# Patient Record
Sex: Female | Born: 1980 | Race: White | Hispanic: No | Marital: Married | Smoking: Never smoker
Health system: Southern US, Community
[De-identification: ages and names within clinical notes are randomized; demographics above are authoritative.]

## PROBLEM LIST (undated history)

## (undated) HISTORY — PX: EYE MUSCLE SURGERY: SHX370

---

## 2004-10-31 ENCOUNTER — Observation Stay: Payer: Self-pay | Admitting: Obstetrics and Gynecology

## 2004-11-07 ENCOUNTER — Observation Stay: Payer: Self-pay | Admitting: Obstetrics and Gynecology

## 2004-11-12 ENCOUNTER — Inpatient Hospital Stay: Payer: Self-pay | Admitting: Obstetrics and Gynecology

## 2014-06-25 ENCOUNTER — Ambulatory Visit: Payer: Self-pay | Admitting: Family Medicine

## 2014-10-03 IMAGING — US ABDOMEN ULTRASOUND
1 series · 14 of 25 positions shown · non-contrast
Comparison: None.

CLINICAL DATA: Right upper quadrant pain .

EXAM:
ULTRASOUND ABDOMEN COMPLETE

[Series 1: abdomen ultrasound · 0.26mm/px · 14 of 77 slices shown]
[im 1/77]
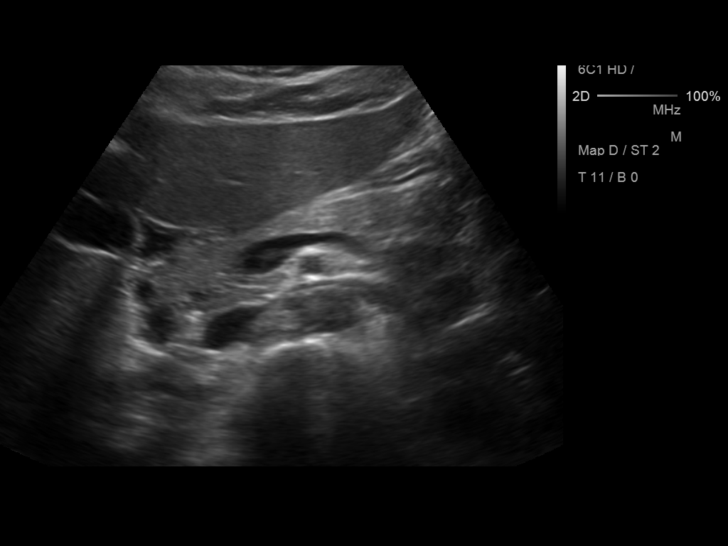
[im 7/77]
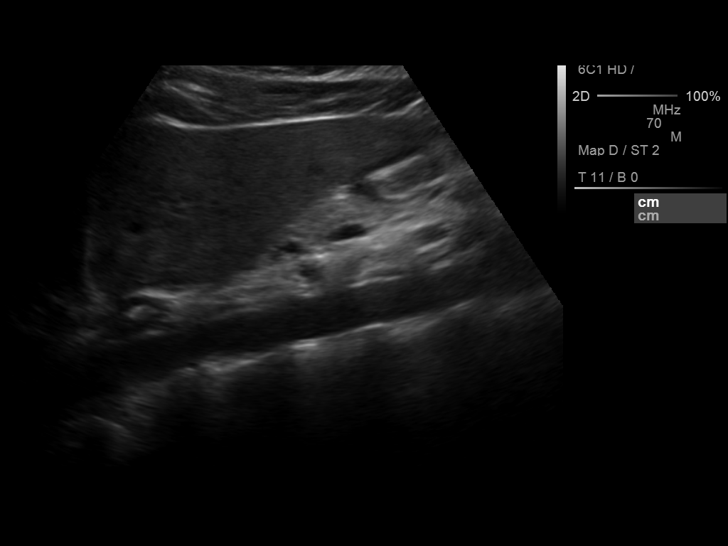
[im 13/77]
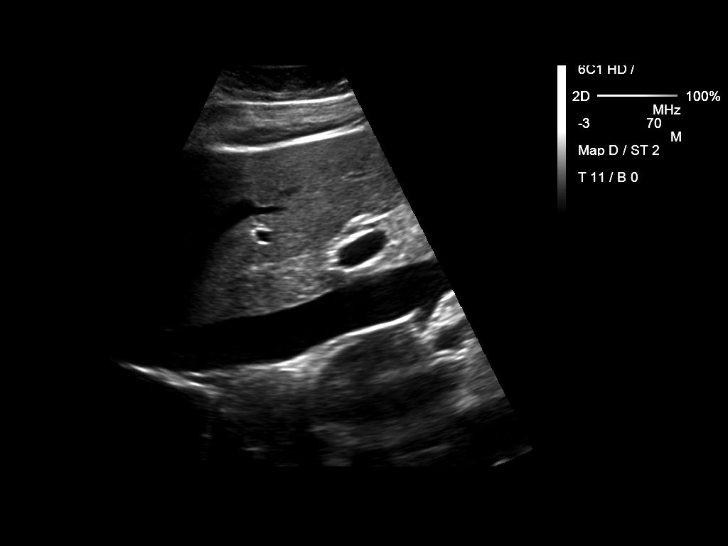
[im 20/77]
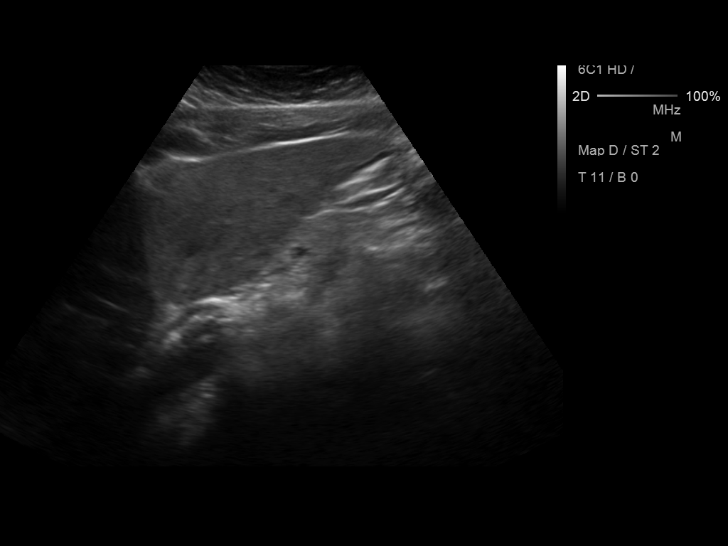
[im 26/77]
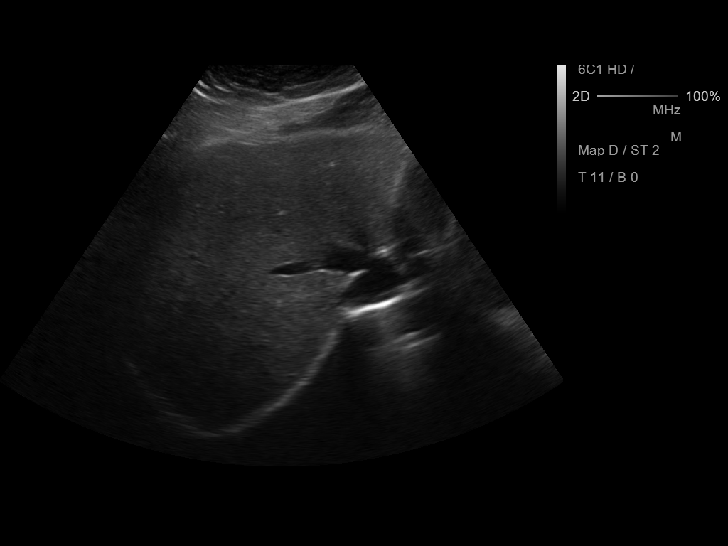
[im 29/77]
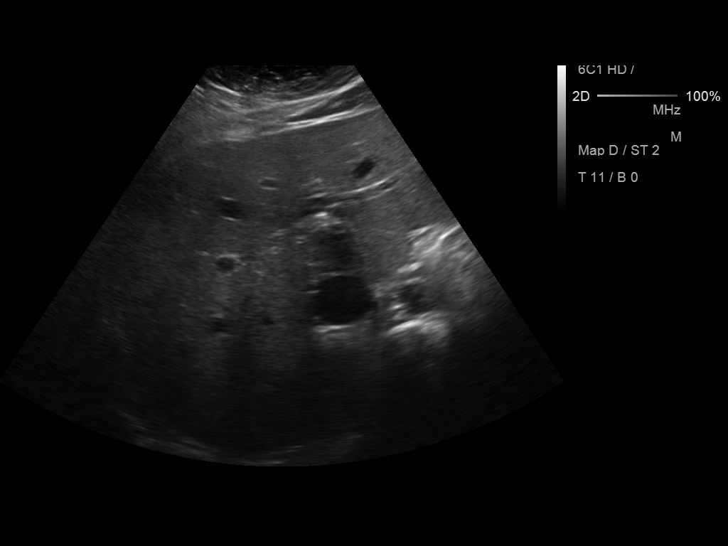
[im 35/77]
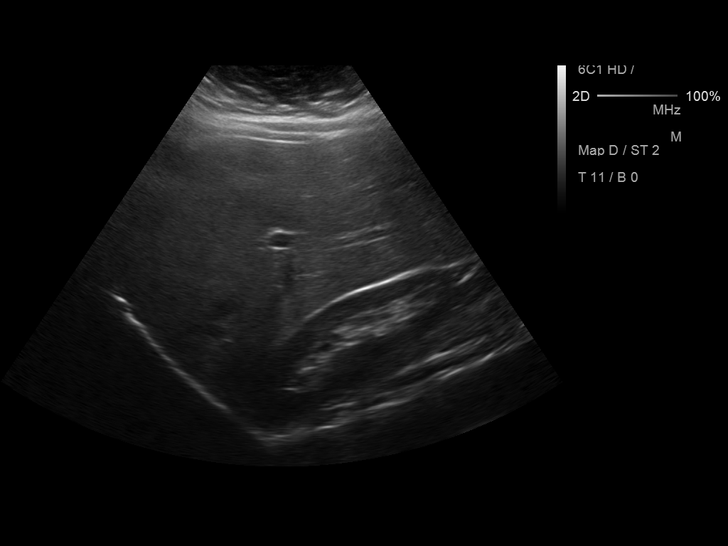
[im 42/77]
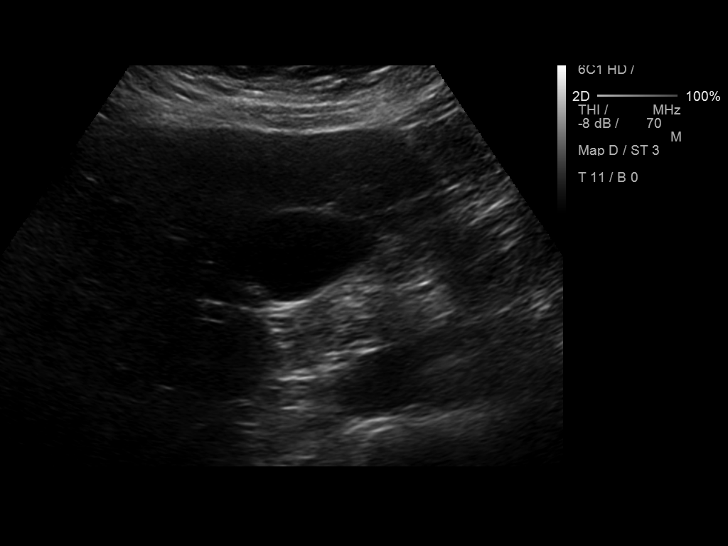
[im 48/77]
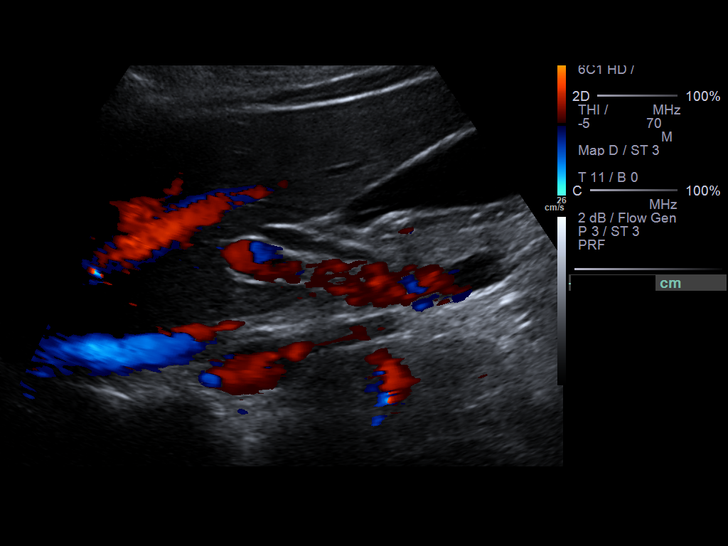
[im 51/77]
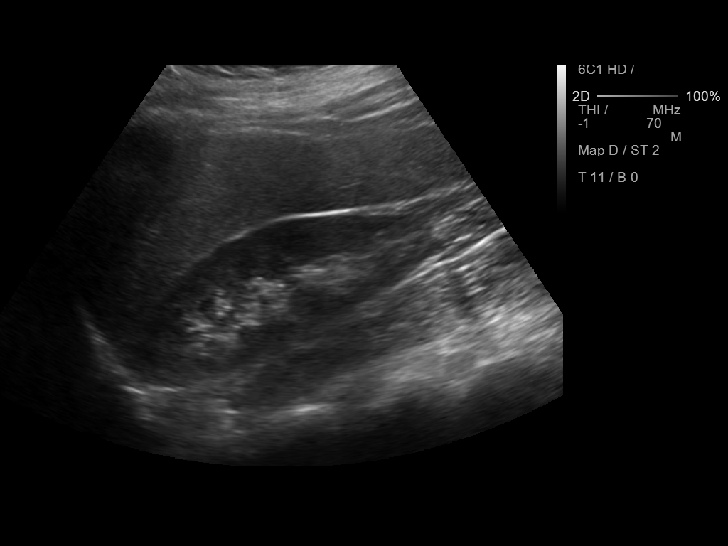
[im 58/77]
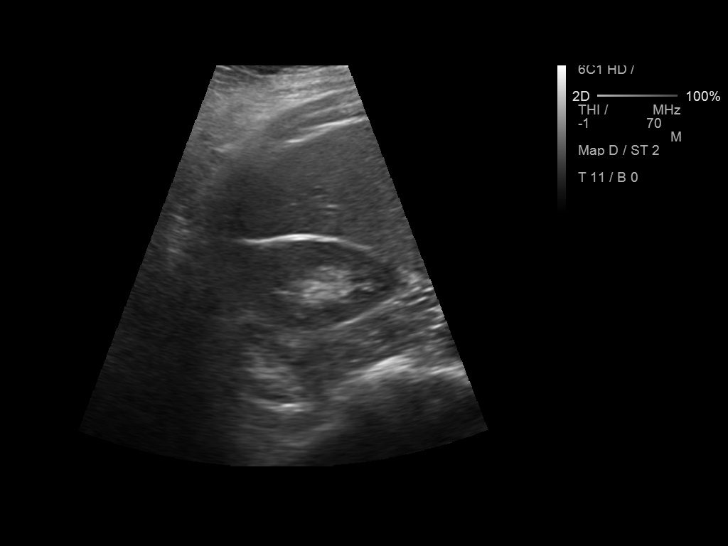
[im 64/77]
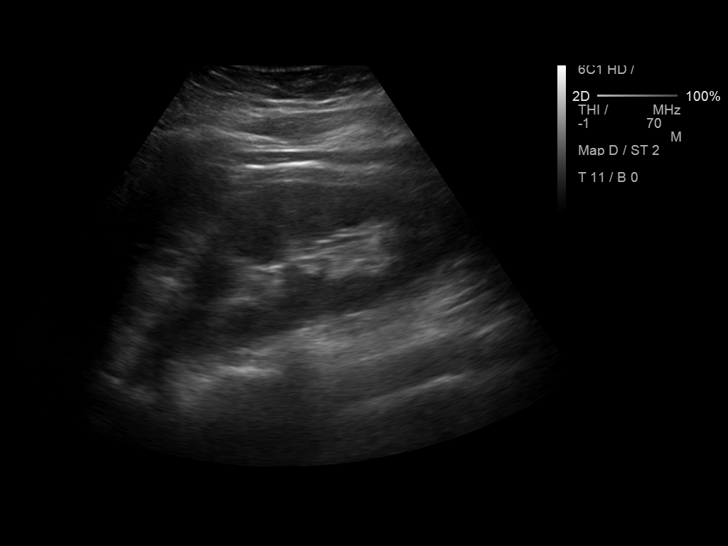
[im 70/77]
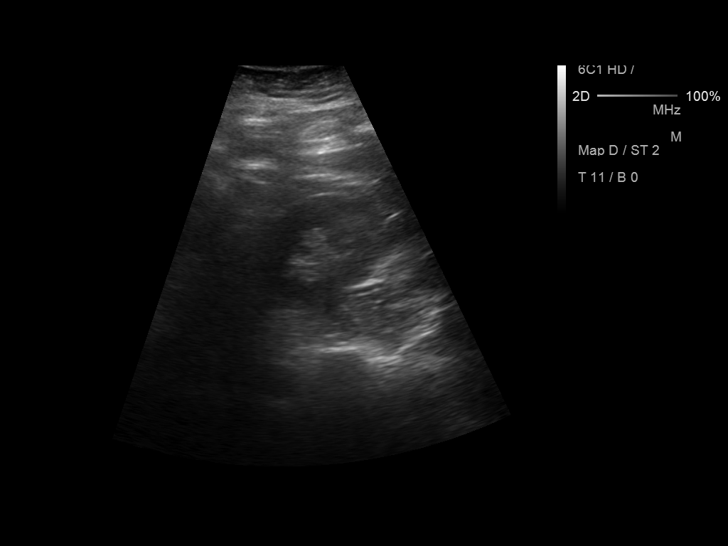
[im 77/77]
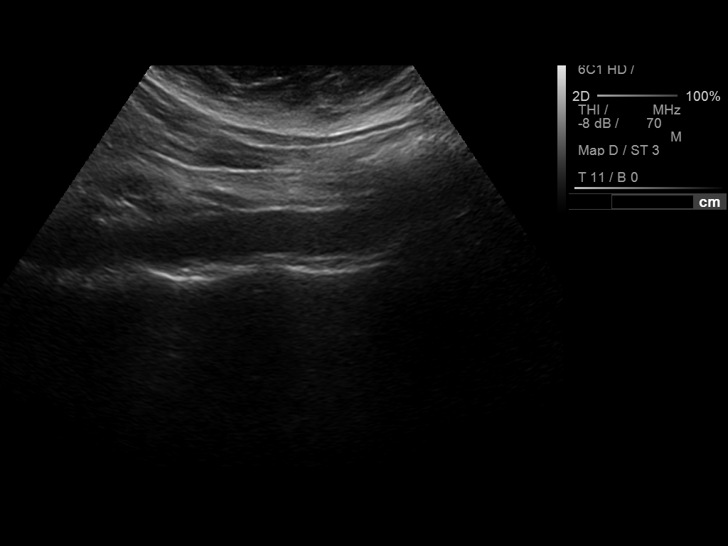

[14 of 25 positions shown; findings below may reference images not displayed]

FINDINGS: Gallbladder:

No gallstones or wall thickening visualized. No sonographic Murphy
sign noted.

Common bile duct:

Diameter: 6.2 mm

Liver:

Mild increased echogenicity liver noted suggesting fatty
infiltration. Mild hepatomegaly cannot be excluded.

IVC:

No abnormality visualized.

Pancreas:

Visualized portion unremarkable.

Spleen:

Size and appearance within normal limits.

Right Kidney:

Length: 11.6 cm. Echogenicity within normal limits. No mass or
hydronephrosis visualized.

Left Kidney:

Length: 12.3 cm. Echogenicity within normal limits. No mass or
hydronephrosis visualized.

Abdominal aorta:

No aneurysm visualized.

Other findings:

None.
IMPRESSION: Fatty infiltration of the liver. Mild hepatomegaly cannot be
excluded. No focal abnormality otherwise noted.

## 2015-05-30 ENCOUNTER — Emergency Department (HOSPITAL_COMMUNITY)
Admission: EM | Admit: 2015-05-30 | Discharge: 2015-05-30 | Disposition: A | Discharge status: Home / Self Care | Payer: Worker's Compensation | Attending: Emergency Medicine | Admitting: Emergency Medicine

## 2015-05-30 ENCOUNTER — Encounter (HOSPITAL_COMMUNITY): Payer: Self-pay | Admitting: *Deleted

## 2015-05-30 ENCOUNTER — Emergency Department (HOSPITAL_COMMUNITY): Payer: Worker's Compensation

## 2015-05-30 DIAGNOSIS — X58XXXA Exposure to other specified factors, initial encounter: Secondary | ICD-10-CM | POA: Diagnosis not present

## 2015-05-30 DIAGNOSIS — Y9289 Other specified places as the place of occurrence of the external cause: Secondary | ICD-10-CM | POA: Insufficient documentation

## 2015-05-30 DIAGNOSIS — S60221A Contusion of right hand, initial encounter: Secondary | ICD-10-CM | POA: Insufficient documentation

## 2015-05-30 DIAGNOSIS — S6991XA Unspecified injury of right wrist, hand and finger(s), initial encounter: Secondary | ICD-10-CM | POA: Diagnosis present

## 2015-05-30 DIAGNOSIS — Y9389 Activity, other specified: Secondary | ICD-10-CM | POA: Insufficient documentation

## 2015-05-30 DIAGNOSIS — Y990 Civilian activity done for income or pay: Secondary | ICD-10-CM | POA: Diagnosis not present

## 2015-05-30 MED ORDER — IBUPROFEN 800 MG PO TABS
800.0000 mg | ORAL_TABLET | Freq: Once | ORAL | Status: AC
  Administered 2015-05-30: 800 mg via ORAL
  Filled 2015-05-30: qty 1

## 2015-05-30 MED ORDER — IBUPROFEN 800 MG PO TABS: 800.0000 mg | ORAL_TABLET | Freq: Three times a day (TID) | ORAL | Status: AC

## 2015-05-30 MED ORDER — ACETAMINOPHEN 325 MG PO TABS
650.0000 mg | ORAL_TABLET | Freq: Once | ORAL | Status: AC
  Administered 2015-05-30: 650 mg via ORAL
  Filled 2015-05-30: qty 2

## 2015-05-30 NOTE — ED Provider Notes (Signed)
CSN: [id number]     Arrival date & time 05/30/15  1556 History   First MD Initiated Contact with Patient 05/30/15 1622     Chief Complaint  Patient presents with  . Hand Pain     (Consider location/radiation/quality/duration/timing/severity/associated sxs/prior Treatment) Patient is a 34 y.o. female presenting with hand injury. The history is provided by the patient.  Hand Injury Location:  Hand Time since incident: earlier today. 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 Hand location:  R hand Pain details:    Quality:  Aching and throbbing   Radiates to:  R forearm   Severity:  Moderate   Onset quality:  Gradual   Timing:  Constant   Progression:  Worsening Chronicity:  New Handedness:  Right-handed Dislocation: no   Foreign body present:  No foreign bodies Relieved by:  Nothing Worsened by:  Movement (palpation) Ineffective treatments:  None tried Associated symptoms: decreased range of motion and tingling   Associated symptoms: no back pain, no neck pain, no numbness and no stiffness   Risk factors: no known bone disorder and no frequent fractures     History reviewed. No pertinent past medical history. Past Surgical History  Procedure Laterality Date  . Eye muscle surgery     History reviewed. No pertinent family history. History  Substance Use Topics  . Smoking status: Never Smoker   . Smokeless tobacco: Not on file  . Alcohol Use: Yes     Comment: once a week   OB History    No data available     Review of Systems  Constitutional: Negative for activity change.       All ROS Neg except as noted in HPI  HENT: Negative for nosebleeds.   Eyes: Negative for photophobia and discharge.  Respiratory: Negative for cough, shortness of breath and wheezing.   Cardiovascular: Negative for chest pain and palpitations.  Gastrointestinal: Negative for abdominal pain and blood in stool.    Genitourinary: Negative for dysuria, frequency and hematuria.  Musculoskeletal: Negative for back pain, arthralgias, stiffness and neck pain.  Skin: Negative.   Neurological: Negative for dizziness, seizures and speech difficulty.  Psychiatric/Behavioral: Negative for hallucinations and confusion.      Allergies  Review of patient's allergies indicates no known allergies.  Home Medications   Prior to Admission medications   Medication Sig Start Date End Date Taking? Authorizing Provider  ibuprofen (ADVIL,MOTRIN) 800 MG tablet Take 1 tablet (800 mg total) by mouth 3 (three) times daily. 05/30/15   Ivery Quale, PA-C   BP 142/63 mmHg  Pulse 94  Temp(Src) 98.7 F (37.1 C) (Oral)  Resp 16  Ht 5\' 10"  (1.778 m)  Wt 230 lb (104.327 kg)  BMI 33.00 kg/m2  SpO2 97% Physical Exam  Constitutional: She is oriented to person, place, and time. She appears well-developed and well-nourished.  Non-toxic appearance.  HENT:  Head: Normocephalic.  Right Ear: Tympanic membrane and external ear normal.  Left Ear: Tympanic membrane and external ear normal.  Eyes: EOM and lids are normal. Pupils are equal, round, and reactive to light.  Neck: Normal range of motion. Neck supple. Carotid bruit is not present.  Cardiovascular: Normal rate, regular rhythm, normal heart sounds, intact distal pulses and normal pulses.   Pulmonary/Chest: Breath sounds normal. No respiratory distress.  Abdominal: Soft. Bowel sounds are normal. There is no tenderness. There is no guarding.  Musculoskeletal: Normal range of motion.  Right hand: She exhibits tenderness and swelling. She exhibits normal capillary refill and no deformity. Normal sensation noted. Normal strength noted.       Hands: Lymphadenopathy:       Head (right side): No submandibular adenopathy present.       Head (left side): No submandibular adenopathy present.    She has no cervical adenopathy.  Neurological: She is alert and oriented to  person, place, and time. She has normal strength. No cranial nerve deficit or sensory deficit.  Skin: Skin is warm and dry.  Psychiatric: She has a normal mood and affect. Her speech is normal.  Nursing note and vitals reviewed.   ED Course  Procedures (including critical care time) Labs Review Labs Reviewed - No data to display  Imaging Review Dg Hand Complete Right  05/30/2015   CLINICAL DATA:  RIGHT hand pain.  Hand injury today.  EXAM: RIGHT HAND - COMPLETE 3+ VIEW  COMPARISON:  None.  FINDINGS: There is no evidence of fracture or dislocation. There is no evidence of arthropathy or other focal bone abnormality. Soft tissues are unremarkable.  IMPRESSION: Negative.   Electronically Signed   By: Andreas Newport M.D.   On: 05/30/2015 16:53     EKG Interpretation None      MDM  Xray of the right hand is negative for fx or dislocation. No neurovascular deficit noted. Watson-Jones dressing and ice pack provided. Pt to follow up with orthopedics if not improving.   Final diagnoses:  Contusion, hand, right, initial encounter    *I have reviewed nursing notes, vital signs, and all appropriate lab and imaging results for this patient.[address], PA-C 06/01/15 1315  Eber Hong, MD 06/02/15 [phone]

## 2015-05-30 NOTE — Discharge Instructions (Signed)
Your x-ray is negative for fracture or dislocation. Please apply ice to your hand, and keep your hand elevated above your heart. Use  of ibuprofen and  of tylenol three times daily. Please use the splint until July 28. Please see the orthopedic specialist listed above if not improving. Hand Contusion  A hand contusion is a deep bruise to the hand. Contusions happen when an injury causes bleeding under the skin. Signs of bruising include pain, puffiness (swelling), and discolored skin. The contusion may turn blue, purple, or yellow. HOME CARE  Put ice on the injured area.  Put ice in a plastic bag.  Place a towel between your skin and the bag.  Leave the ice on for 15-20 minutes, 03-04 times a day.  Only take medicines as told by your doctor.  Use an elastic wrap only as told. You may remove the wrap for sleeping, showering, and bathing. Take the wrap off if you lose feeling (have numbness) in your fingers, or they turn blue or cold. Put the wrap on more loosely.  Keep the hand raised (elevated) with pillows.  Avoid using your hand too much if it is painful. GET HELP RIGHT AWAY IF:   You have more redness, puffiness, or pain in your hand.  Your puffiness or pain does not get better with medicine.  You lose feeling in your hand, or you cannot move your fingers.  Your hand turns cold or blue.  You have pain when you move your fingers.  Your hand feels warm.  Your contusion does not get better in 2 days. MAKE SURE YOU:   Understand these instructions.  Will watch this condition.  Will get help right away if you are not doing well or you get worse. Document Released: 04/09/2008 Document Revised: 03/08/2014 Document Reviewed: 04/14/2012 [facility] Patient Information 2015 [place], [place]. This information is not intended to replace advice given to you by your health care provider. Make sure you discuss any questions you have with your health care provider.

## 2015-05-30 NOTE — ED Notes (Signed)
Pt given discharge instruction and work man comp paperwork- escorted to lab to provide urine specimen  For post injury drug screen

## 2015-05-30 NOTE — ED Notes (Signed)
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

## 2015-09-07 IMAGING — DX DG HAND COMPLETE 3+V*R*
3 series · 3 of 3 positions shown · non-contrast
Comparison: None.

CLINICAL DATA: RIGHT hand pain.  Hand injury today.

EXAM:
RIGHT HAND - COMPLETE 3+ VIEW

[hand pa]
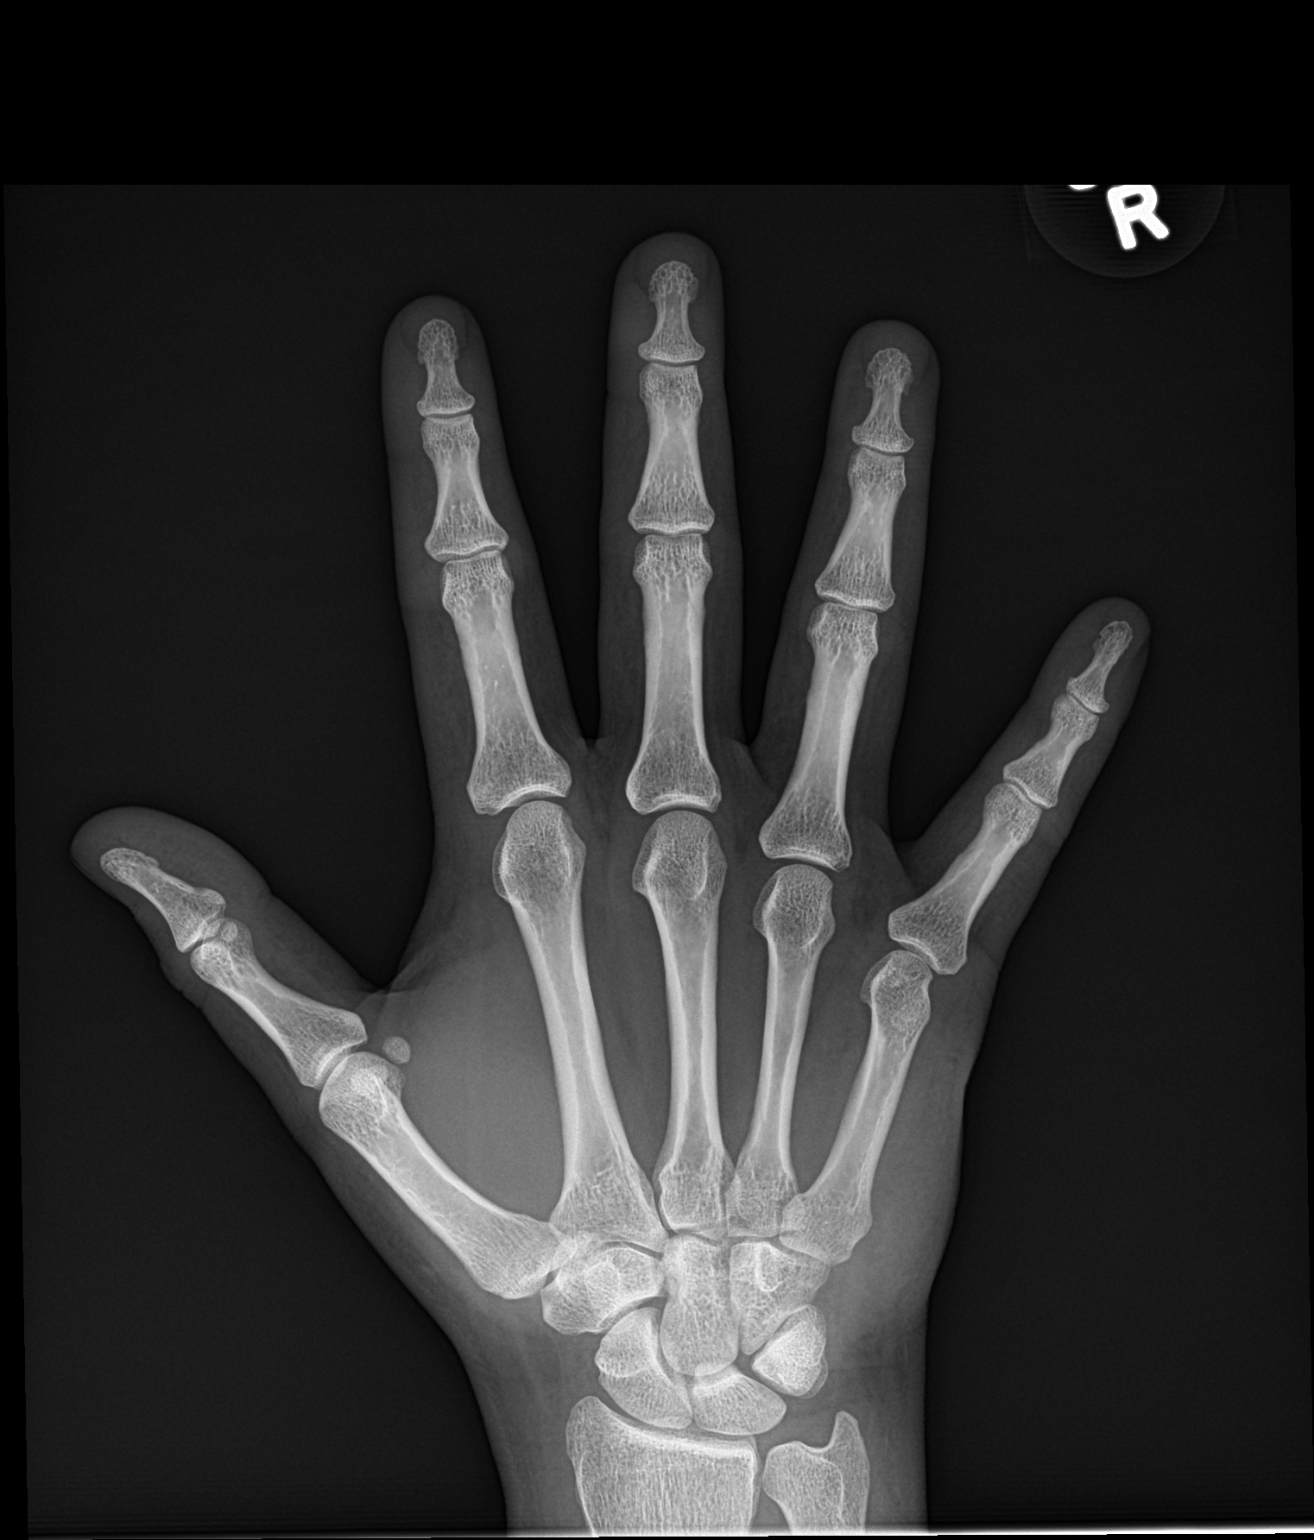

[hand obl]
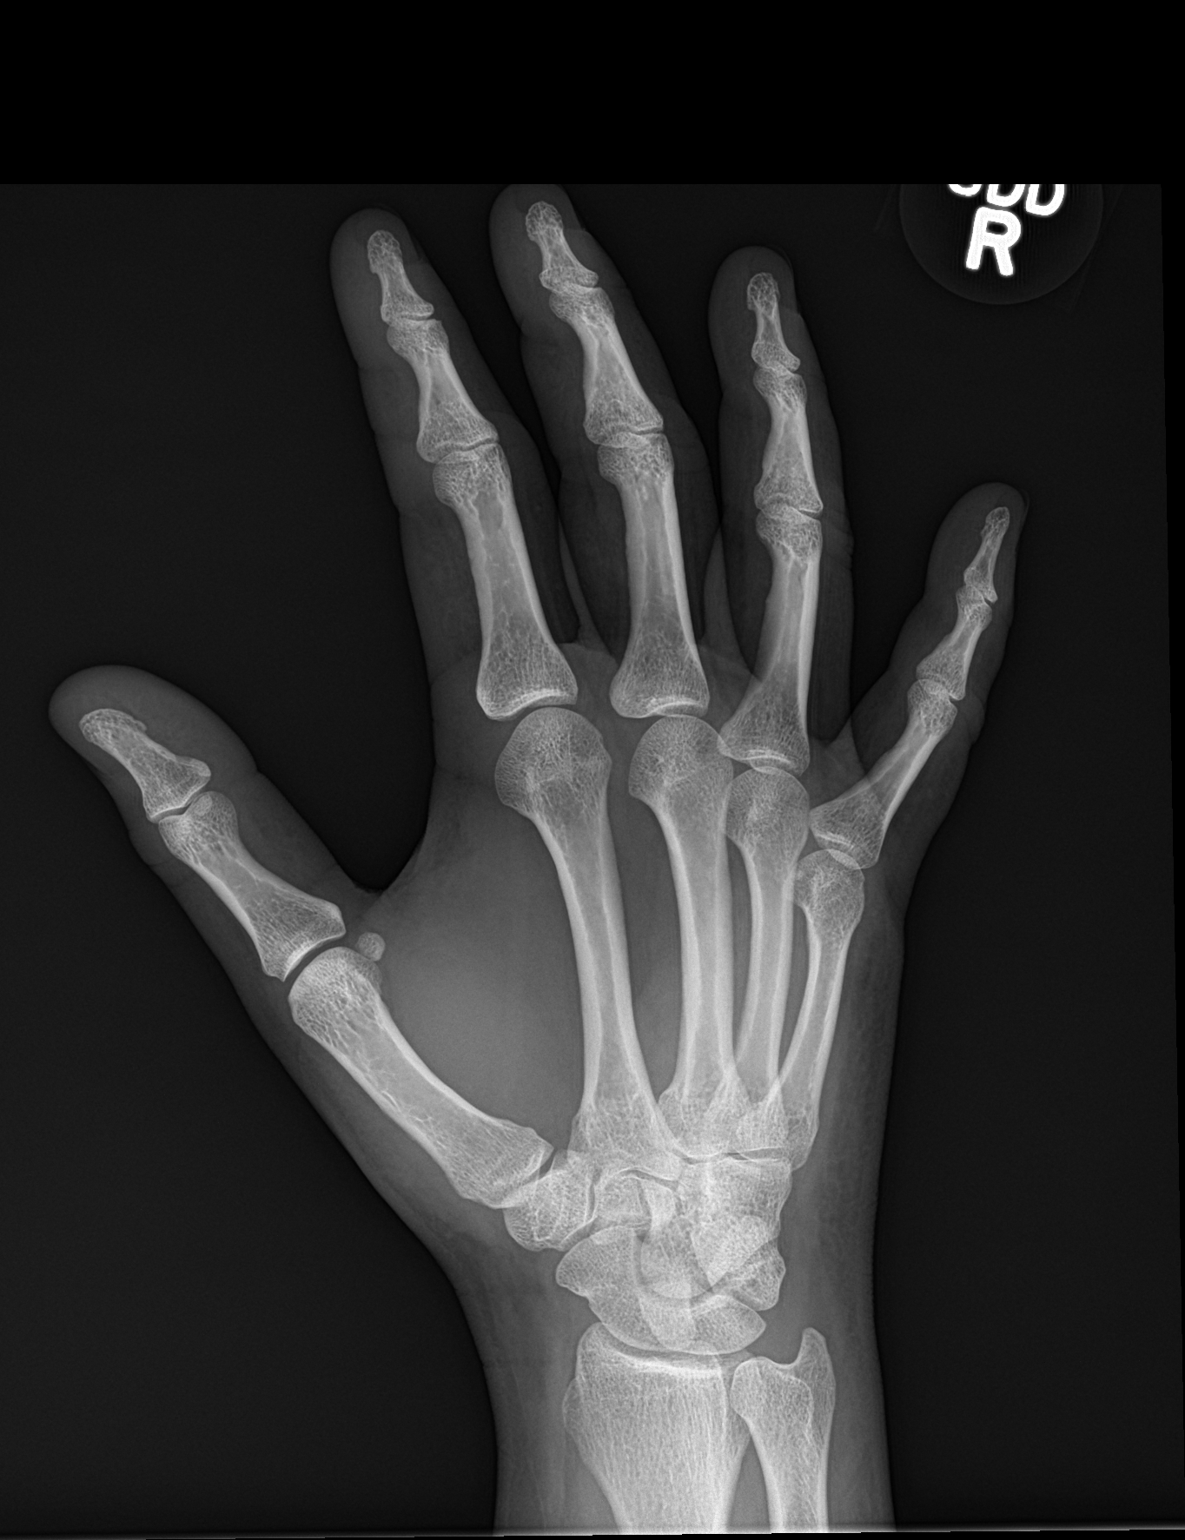

[hand lat]
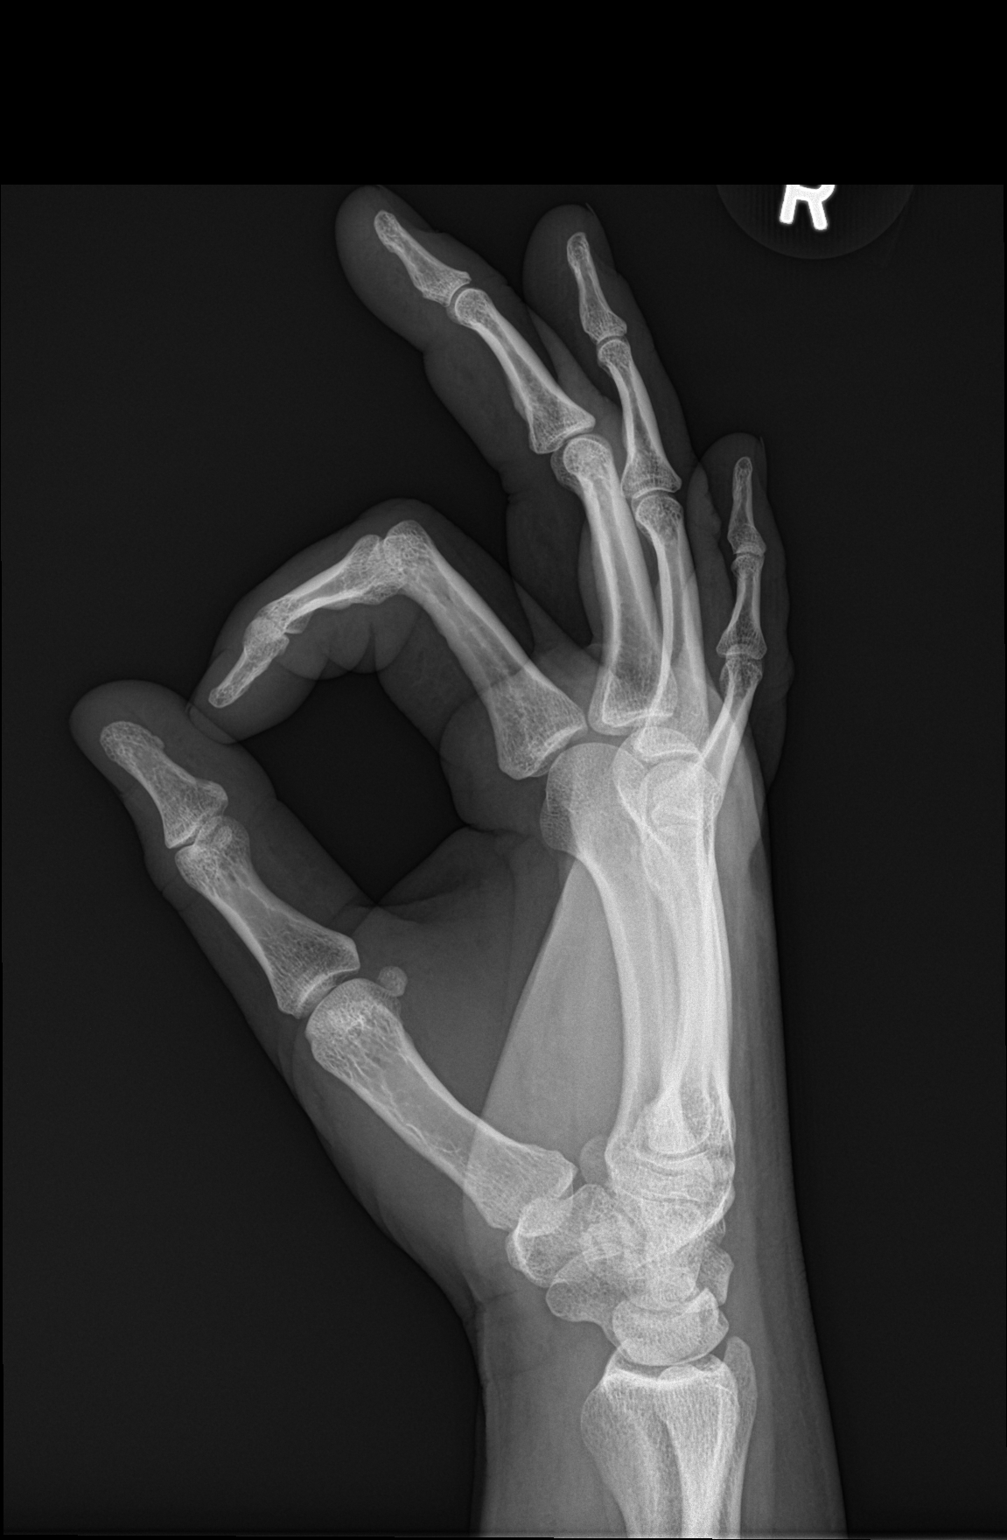

[3 of 3 positions shown; findings below may reference images not displayed]

FINDINGS: There is no evidence of fracture or dislocation. There is no
evidence of arthropathy or other focal bone abnormality. Soft
tissues are unremarkable.
IMPRESSION: Negative.
